# Patient Record
Sex: Female | Born: 1970 | Race: White | Hispanic: No | Marital: Married | Smoking: Never smoker
Health system: Southern US, Community
[De-identification: ages and names within clinical notes are randomized; demographics above are authoritative.]

## PROBLEM LIST (undated history)

## (undated) DIAGNOSIS — E119 Type 2 diabetes mellitus without complications: Secondary | ICD-10-CM

## (undated) DIAGNOSIS — G43909 Migraine, unspecified, not intractable, without status migrainosus: Secondary | ICD-10-CM

## (undated) HISTORY — PX: ABDOMINAL HYSTERECTOMY: SHX81

## (undated) HISTORY — PX: KIDNEY SURGERY: SHX687

## (undated) HISTORY — PX: TONSILLECTOMY: SUR1361

## (undated) HISTORY — DX: Type 2 diabetes mellitus without complications: E11.9

---

## 2013-07-21 ENCOUNTER — Emergency Department (HOSPITAL_BASED_OUTPATIENT_CLINIC_OR_DEPARTMENT_OTHER): Payer: 59

## 2013-07-21 ENCOUNTER — Emergency Department (HOSPITAL_BASED_OUTPATIENT_CLINIC_OR_DEPARTMENT_OTHER)
Admission: EM | Admit: 2013-07-21 | Discharge: 2013-07-21 | Disposition: A | Discharge status: Home / Self Care | Payer: 59 | Attending: Emergency Medicine | Admitting: Emergency Medicine

## 2013-07-21 ENCOUNTER — Encounter (HOSPITAL_BASED_OUTPATIENT_CLINIC_OR_DEPARTMENT_OTHER): Payer: Self-pay | Admitting: Emergency Medicine

## 2013-07-21 DIAGNOSIS — Z880 Allergy status to penicillin: Secondary | ICD-10-CM | POA: Insufficient documentation

## 2013-07-21 DIAGNOSIS — G43109 Migraine with aura, not intractable, without status migrainosus: Secondary | ICD-10-CM | POA: Insufficient documentation

## 2013-07-21 DIAGNOSIS — G43909 Migraine, unspecified, not intractable, without status migrainosus: Secondary | ICD-10-CM

## 2013-07-21 DIAGNOSIS — R209 Unspecified disturbances of skin sensation: Secondary | ICD-10-CM | POA: Insufficient documentation

## 2013-07-21 HISTORY — DX: Migraine, unspecified, not intractable, without status migrainosus: G43.909

## 2013-07-21 MED ORDER — METOCLOPRAMIDE HCL 10 MG PO TABS: 10.0000 mg | ORAL_TABLET | Freq: Four times a day (QID) | ORAL | Status: AC | PRN

## 2013-07-21 MED ORDER — METOCLOPRAMIDE HCL 5 MG/ML IJ SOLN
10.0000 mg | Freq: Once | INTRAMUSCULAR | Status: AC
  Administered 2013-07-21: 10 mg via INTRAVENOUS
  Filled 2013-07-21: qty 2

## 2013-07-21 MED ORDER — SODIUM CHLORIDE 0.9 % IV BOLUS (SEPSIS)
1000.0000 mL | Freq: Once | INTRAVENOUS | Status: AC
  Administered 2013-07-21: 1000 mL via INTRAVENOUS

## 2013-07-21 MED ORDER — DEXAMETHASONE SODIUM PHOSPHATE 10 MG/ML IJ SOLN
10.0000 mg | Freq: Once | INTRAMUSCULAR | Status: AC
  Administered 2013-07-21: 10 mg via INTRAVENOUS
  Filled 2013-07-21: qty 1

## 2013-07-21 MED ORDER — KETOROLAC TROMETHAMINE 30 MG/ML IJ SOLN
30.0000 mg | Freq: Once | INTRAMUSCULAR | Status: AC
  Administered 2013-07-21: 30 mg via INTRAVENOUS
  Filled 2013-07-21: qty 1

## 2013-07-21 MED ORDER — DIPHENHYDRAMINE HCL 50 MG/ML IJ SOLN
50.0000 mg | Freq: Once | INTRAMUSCULAR | Status: AC
  Administered 2013-07-21: 50 mg via INTRAVENOUS
  Filled 2013-07-21: qty 1

## 2013-07-21 NOTE — Discharge Instructions (Signed)
If you were given medicines take as directed.  If you are on coumadin or contraceptives realize their levels and effectiveness is altered by many different medicines.  If you have any reaction (rash, tongues swelling, other) to the medicines stop taking and see a physician.   Please follow up as directed and return to the ER or see a physician for new or worsening symptoms.  Thank you. Take reglan with benadryl for headache or nausea.  Headaches, Frequently Asked Questions MIGRAINE HEADACHES Q: What is migraine? What causes it? How can I treat it? A: Generally, migraine headaches begin as a dull ache. Then they develop into a constant, throbbing, and pulsating pain. You may experience pain at the temples. You may experience pain at the front or back of one or both sides of the head. The pain is usually accompanied by a combination of:  Nausea.  Vomiting.  Sensitivity to light and noise. Some people (about 15%) experience an aura (see below) before an attack. The cause of migraine is believed to be chemical reactions in the brain. Treatment for migraine may include over-the-counter or prescription medications. It may also include self-help techniques. These include relaxation training and biofeedback.  Q: What is an aura? A: About 15% of people with migraine get an "aura". This is a sign of neurological symptoms that occur before a migraine headache. You may see wavy or jagged lines, dots, or flashing lights. You might experience tunnel vision or blind spots in one or both eyes. The aura can include visual or auditory hallucinations (something imagined). It may include disruptions in smell (such as strange odors), taste or touch. Other symptoms include:  Numbness.  A "pins and needles" sensation.  Difficulty in recalling or speaking the correct word. These neurological events may last as long as 60 minutes. These symptoms will fade as the headache begins. Q: What is a trigger? A: Certain  physical or environmental factors can lead to or "trigger" a migraine. These include:  Foods.  Hormonal changes.  Weather.  Stress. It is important to remember that triggers are different for everyone. To help prevent migraine attacks, you need to figure out which triggers affect you. Keep a headache diary. This is a good way to track triggers. The diary will help you talk to your healthcare professional about your condition. Q: Does weather affect migraines? A: Bright sunshine, hot, humid conditions, and drastic changes in barometric pressure may lead to, or "trigger," a migraine attack in some people. But studies have shown that weather does not act as a trigger for everyone with migraines. Q: What is the link between migraine and hormones? A: Hormones start and regulate many of your body's functions. Hormones keep your body in balance within a constantly changing environment. The levels of hormones in your body are unbalanced at times. Examples are during menstruation, pregnancy, or menopause. That can lead to a migraine attack. In fact, about three quarters of all women with migraine report that their attacks are related to the menstrual cycle.  Q: Is there an increased risk of stroke for migraine sufferers? A: The likelihood of a migraine attack causing a stroke is very remote. That is not to say that migraine sufferers cannot have a stroke associated with their migraines. In persons under age 63, the most common associated factor for stroke is migraine headache. But over the course of a person's normal life span, the occurrence of migraine headache may actually be associated with a reduced risk of dying from  cerebrovascular disease due to stroke.  Q: What are acute medications for migraine? A: Acute medications are used to treat the pain of the headache after it has started. Examples over-the-counter medications, NSAIDs, ergots, and triptans.  Q: What are the triptans? A: Triptans are the  newest class of abortive medications. They are specifically targeted to treat migraine. Triptans are vasoconstrictors. They moderate some chemical reactions in the brain. The triptans work on receptors in your brain. Triptans help to restore the balance of a neurotransmitter called serotonin. Fluctuations in levels of serotonin are thought to be a main cause of migraine.  Q: Are over-the-counter medications for migraine effective? A: Over-the-counter, or "OTC," medications may be effective in relieving mild to moderate pain and associated symptoms of migraine. But you should see your caregiver before beginning any treatment regimen for migraine.  Q: What are preventive medications for migraine? A: Preventive medications for migraine are sometimes referred to as "prophylactic" treatments. They are used to reduce the frequency, severity, and length of migraine attacks. Examples of preventive medications include antiepileptic medications, antidepressants, beta-blockers, calcium channel blockers, and NSAIDs (nonsteroidal anti-inflammatory drugs). Q: Why are anticonvulsants used to treat migraine? A: During the past few years, there has been an increased interest in antiepileptic drugs for the prevention of migraine. They are sometimes referred to as "anticonvulsants". Both epilepsy and migraine may be caused by similar reactions in the brain.  Q: Why are antidepressants used to treat migraine? A: Antidepressants are typically used to treat people with depression. They may reduce migraine frequency by regulating chemical levels, such as serotonin, in the brain.  Q: What alternative therapies are used to treat migraine? A: The term "alternative therapies" is often used to describe treatments considered outside the scope of conventional Western medicine. Examples of alternative therapy include acupuncture, acupressure, and yoga. Another common alternative treatment is herbal therapy. Some herbs are believed to  relieve headache pain. Always discuss alternative therapies with your caregiver before proceeding. Some herbal products contain arsenic and other toxins. TENSION HEADACHES Q: What is a tension-type headache? What causes it? How can I treat it? A: Tension-type headaches occur randomly. They are often the result of temporary stress, anxiety, fatigue, or anger. Symptoms include soreness in your temples, a tightening band-like sensation around your head (a "vice-like" ache). Symptoms can also include a pulling feeling, pressure sensations, and contracting head and neck muscles. The headache begins in your forehead, temples, or the back of your head and neck. Treatment for tension-type headache may include over-the-counter or prescription medications. Treatment may also include self-help techniques such as relaxation training and biofeedback. CLUSTER HEADACHES Q: What is a cluster headache? What causes it? How can I treat it? A: Cluster headache gets its name because the attacks come in groups. The pain arrives with little, if any, warning. It is usually on one side of the head. A tearing or bloodshot eye and a runny nose on the same side of the headache may also accompany the pain. Cluster headaches are believed to be caused by chemical reactions in the brain. They have been described as the most severe and intense of any headache type. Treatment for cluster headache includes prescription medication and oxygen. SINUS HEADACHES Q: What is a sinus headache? What causes it? How can I treat it? A: When a cavity in the bones of the face and skull (a sinus) becomes inflamed, the inflammation will cause localized pain. This condition is usually the result of an allergic reaction, a tumor, or  an infection. If your headache is caused by a sinus blockage, such as an infection, you will probably have a fever. An x-ray will confirm a sinus blockage. Your caregiver's treatment might include antibiotics for the infection, as  well as antihistamines or decongestants.  REBOUND HEADACHES Q: What is a rebound headache? What causes it? How can I treat it? A: A pattern of taking acute headache medications too often can lead to a condition known as "rebound headache." A pattern of taking too much headache medication includes taking it more than 2 days per week or in excessive amounts. That means more than the label or a caregiver advises. With rebound headaches, your medications not only stop relieving pain, they actually begin to cause headaches. Doctors treat rebound headache by tapering the medication that is being overused. Sometimes your caregiver will gradually substitute a different type of treatment or medication. Stopping may be a challenge. Regularly overusing a medication increases the potential for serious side effects. Consult a caregiver if you regularly use headache medications more than 2 days per week or more than the label advises. ADDITIONAL QUESTIONS AND ANSWERS Q: What is biofeedback? A: Biofeedback is a self-help treatment. Biofeedback uses special equipment to monitor your body's involuntary physical responses. Biofeedback monitors:  Breathing.  Pulse.  Heart rate.  Temperature.  Muscle tension.  Brain activity. Biofeedback helps you refine and perfect your relaxation exercises. You learn to control the physical responses that are related to stress. Once the technique has been mastered, you do not need the equipment any more. Q: Are headaches hereditary? A: Four out of five (80%) of people that suffer report a family history of migraine. Scientists are not sure if this is genetic or a family predisposition. Despite the uncertainty, a child has a 50% chance of having migraine if one parent suffers. The child has a 75% chance if both parents suffer.  Q: Can children get headaches? A: By the time they reach high school, most young people have experienced some type of headache. Many safe and effective  approaches or medications can prevent a headache from occurring or stop it after it has begun.  Q: What type of doctor should I see to diagnose and treat my headache? A: Start with your primary caregiver. Discuss his or her experience and approach to headaches. Discuss methods of classification, diagnosis, and treatment. Your caregiver may decide to recommend you to a headache specialist, depending upon your symptoms or other physical conditions. Having diabetes, allergies, etc., may require a more comprehensive and inclusive approach to your headache. The National Headache Foundation will provide, upon request, a list of [facility] physician members in your state. Document Released: 09/25/2003 Document Revised: 09/27/2011 Document Reviewed: 03/04/2008 [facility] Patient Information 2014 [place], [place].

## 2013-07-21 NOTE — ED Notes (Signed)
Took Pt to the restroom

## 2013-07-21 NOTE — ED Notes (Signed)
Pt having headache x 5 days.  Pt states it is NOT typical of her migraines.  Location is different and it not responding to medications.  No fever or neck stiffness.

## 2013-07-21 NOTE — ED Provider Notes (Addendum)
CSN: [id number]     Arrival date & time 07/21/13  [id number] History   First MD Initiated Contact with Patient 07/21/13 [id number]     Chief Complaint  Patient presents with  . Headache   (Consider location/radiation/quality/duration/timing/severity/associated sxs/prior Treatment) HPI Comments: 43 yo female with migraine hx, non smoker, no blood clot or stroke hx presents with headache.  Pt has had a headache the past 5 days, intermittent, worse in the morning, located behind right eye.  Pt has had migraines in the past and the feeling is similar but more severe and location is different. She has had the similar visual aura with these migraine.  She has mild right arm numbness.  No head injury, travel, fevers or neck stiffness.  No vision loss. She tried normal triptan/ OTC meds but no improvement.  Gradual onset, gradual worsening.  No CNS bleed hx.   Patient is a 43 y.o. female presenting with headaches. The history is provided by the patient.  Headache   Past Medical History  Diagnosis Date  . Migraine    Past Surgical History  Procedure Laterality Date  . Abdominal hysterectomy    . Kidney surgery    . Tonsillectomy     No family history on file. History  Substance Use Topics  . Smoking status: Never Smoker   . Smokeless tobacco: Not on file  . Alcohol Use: Not on file   OB History   Grav Para Term Preterm Abortions TAB SAB Ect Mult Living                 Review of Systems  Neurological: Positive for headaches.    Allergies  Penicillins and Sulfa antibiotics  Home Medications   Current Outpatient Rx  Name  Route  Sig  Dispense  Refill  . acetaminophen-codeine (TYLENOL #3) 300-30 MG per tablet   Oral   Take by mouth every 4 (four) hours as needed for moderate pain.         . rizatriptan (MAXALT) 10 MG tablet   Oral   Take 10 mg by mouth as needed for migraine. May repeat in 2 hours if needed          BP 131/73  Pulse 81  Temp(Src) 98 F (36.7 C) (Oral)  Resp 20   Ht 5\' 10"  (1.778 m)  Wt 212 lb (96.163 kg)  BMI 30.42 kg/m2  SpO2 100% Physical Exam  Nursing note and vitals reviewed. Constitutional: She is oriented to person, place, and time. She appears well-developed and well-nourished.  HENT:  Head: Normocephalic and atraumatic.  Mild dry mm  Eyes: Conjunctivae are normal. Right eye exhibits no discharge. Left eye exhibits no discharge.  Neck: Normal range of motion. Neck supple. No tracheal deviation present.  Cardiovascular: Normal rate and regular rhythm.   Pulmonary/Chest: Effort normal and breath sounds normal.  Abdominal: Soft. She exhibits no distension. There is no tenderness. There is no guarding.  Musculoskeletal: She exhibits no edema.  Neurological: She is alert and oriented to person, place, and time.  5+ strength in UE and LE with f/e at major joints. Sensation to palpation intact in UE and LE. CNs 2-12 grossly intact.  EOMFI.  PERRL.   Finger nose and coordination intact bilateral.   Visual fields intact to finger testing.   Skin: Skin is warm. No rash noted.  Psychiatric: She has a normal mood and affect.    ED Course  Procedures (including critical care time) Labs Review Labs Reviewed -  No data to display Imaging Review Ct Head Wo Contrast  07/21/2013   CLINICAL DATA:  Headache for 5 days  EXAM: CT HEAD WITHOUT CONTRAST  TECHNIQUE: Contiguous axial images were obtained from the base of the skull through the vertex without intravenous contrast.  COMPARISON:  None.  FINDINGS: No acute intracranial hemorrhage. No focal mass lesion. No CT evidence of acute infarction. No midline shift or mass effect. No hydrocephalus. Basilar cisterns are patent. Paranasal sinuses and mastoid air cells are clear.  IMPRESSION: Normal head CT   Electronically Signed   By: Genevive Bi M.D.   On: 07/21/2013 11:15    EKG Interpretation   None       MDM   1. Migraine    Many components of exam/ hpi migraine like- hx of migraine,  aura, unilateral however pain is more severe and different location.  Normal neuro exam.  Low risk stroke.  Screening head CT. Fluids, migraine cocktail. CT no acute findings, pt improved significantly in ED on recheck.  No concern for [facility] or meningitis at this time.  Results and differential diagnosis were discussed with the patient. Close follow up outpatient was discussed, patient comfortable with the plan.         Enid Skeens, MD 07/21/13 1302  Enid Skeens, MD 07/21/13 1302

## 2015-01-07 IMAGING — CT CT HEAD W/O CM
1 series · 16 of 30 positions shown, 20 images · non-contrast
Comparison: None.

CLINICAL DATA: Headache for 5 days

EXAM:
CT HEAD WITHOUT CONTRAST
TECHNIQUE: Contiguous axial images were obtained from the base of the skull
through the vertex without intravenous contrast.

[Series 2: head 4.8 h37s · axial · 0.45mm/px · z∈[-170,-37]mm · 16 of 32 slices shown, 20 images]
[im 2/32  brain]
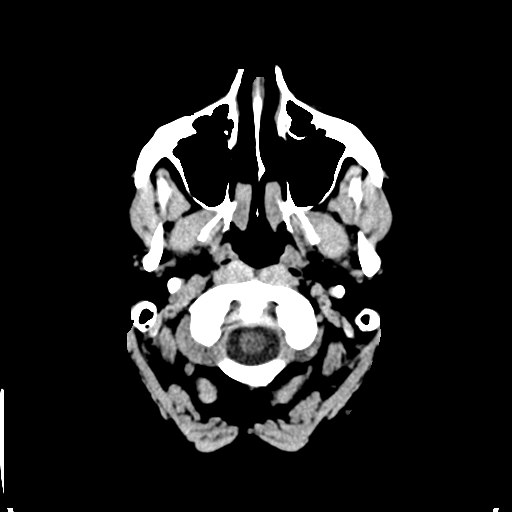
[im 2/32  bone]
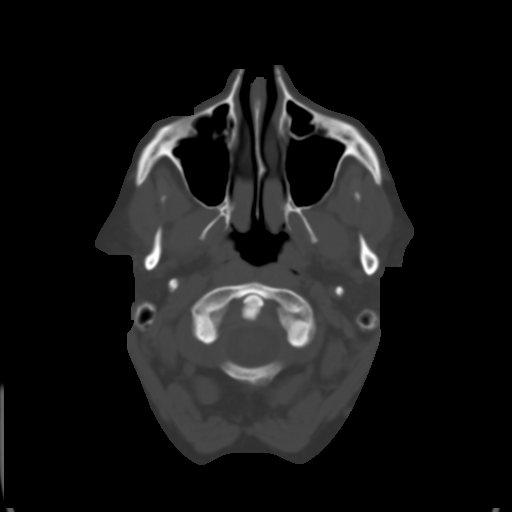
[im 4/32  brain]
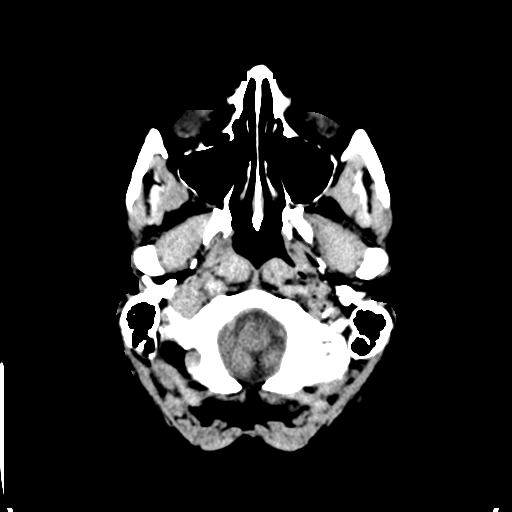
[im 6/32  brain]
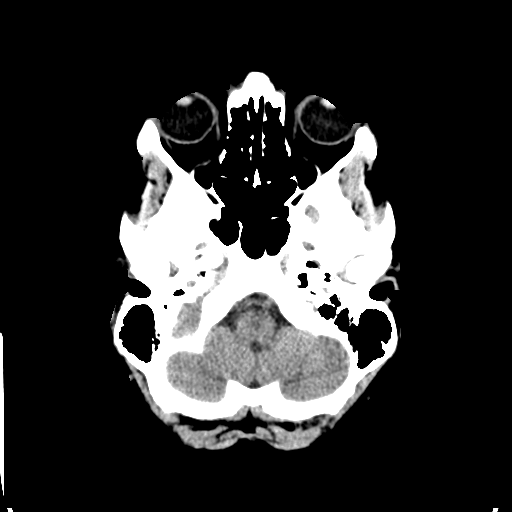
[im 8/32  brain]
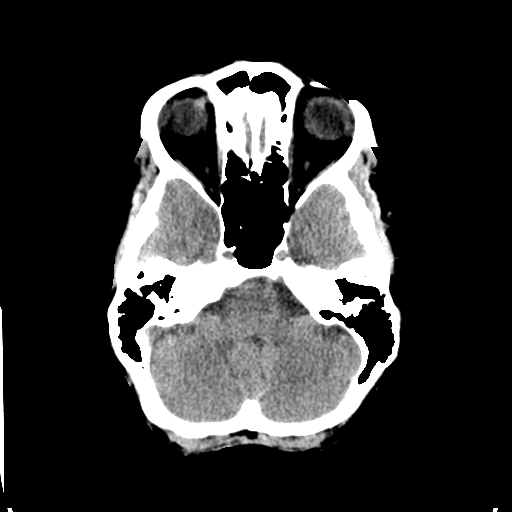
[im 9/32  brain]
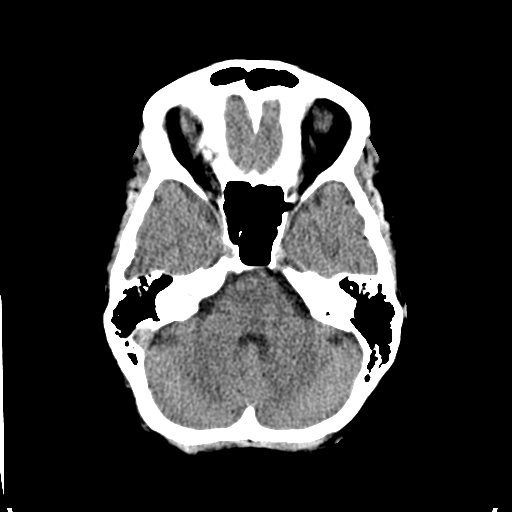
[im 9/32  bone]
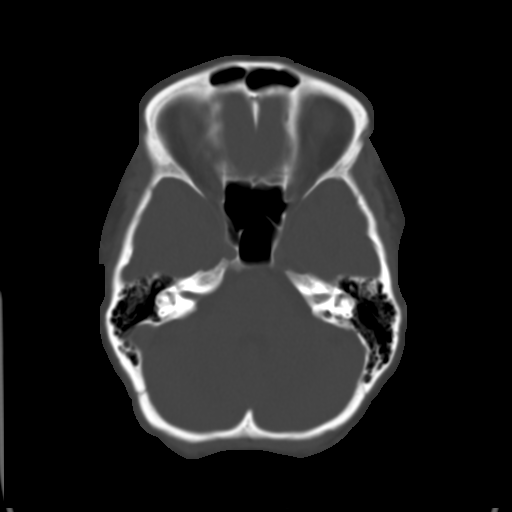
[im 11/32  brain]
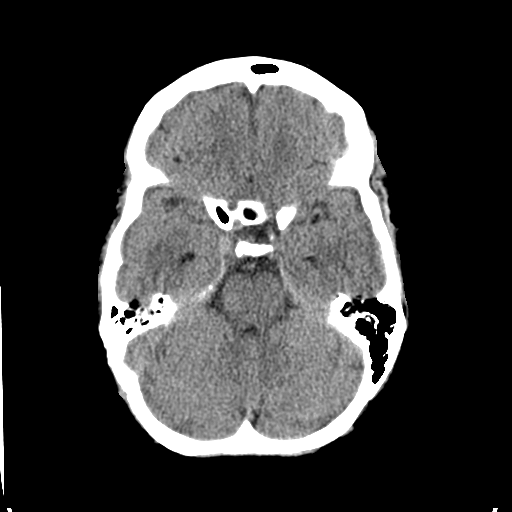
[im 13/32  brain]
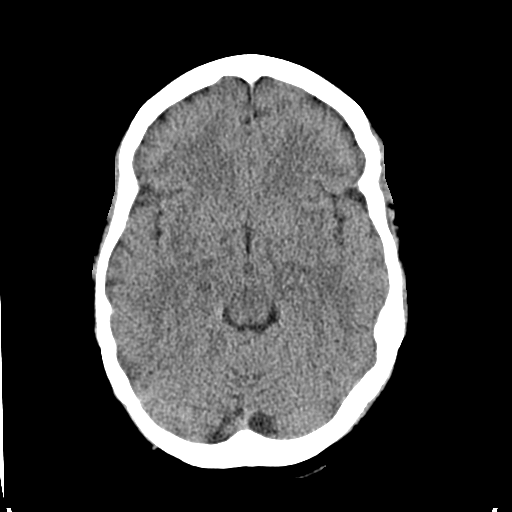
[im 15/32  brain]
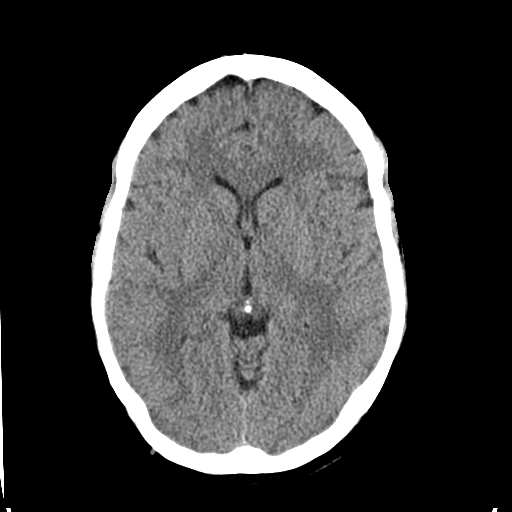
[im 17/32  brain]
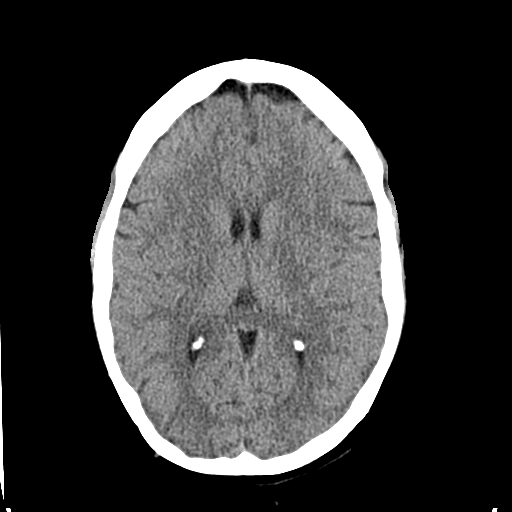
[im 17/32  bone]
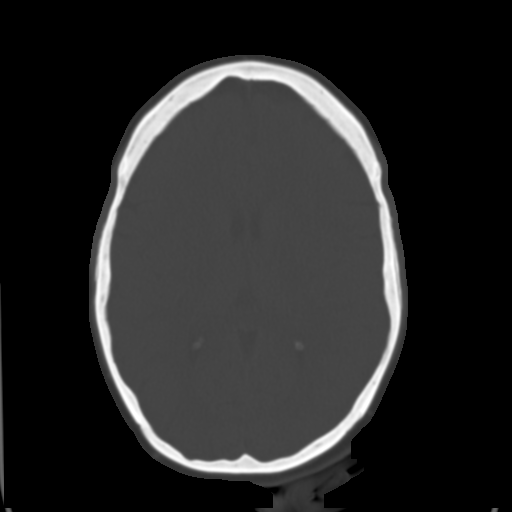
[im 19/32  brain]
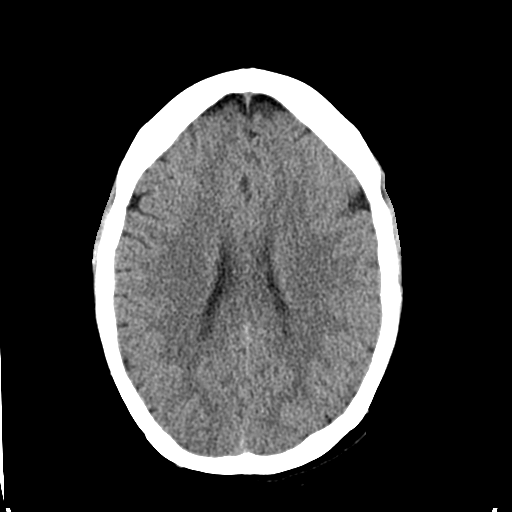
[im 21/32  brain]
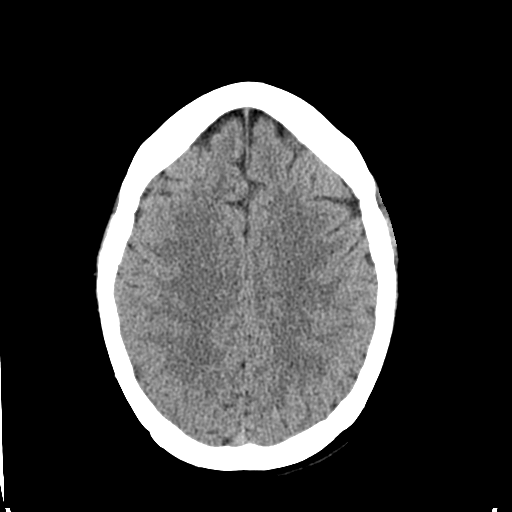
[im 23/32  brain]
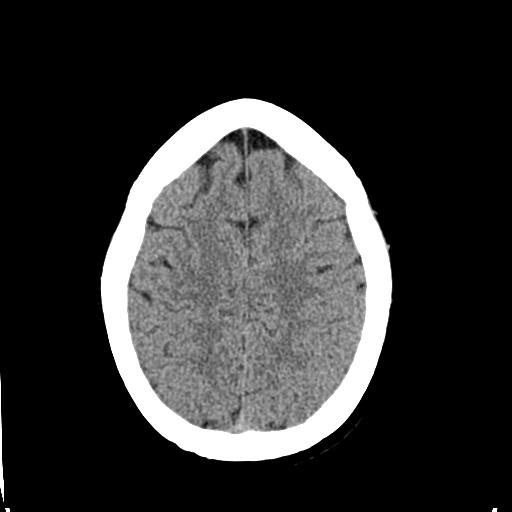
[im 24/32  brain]
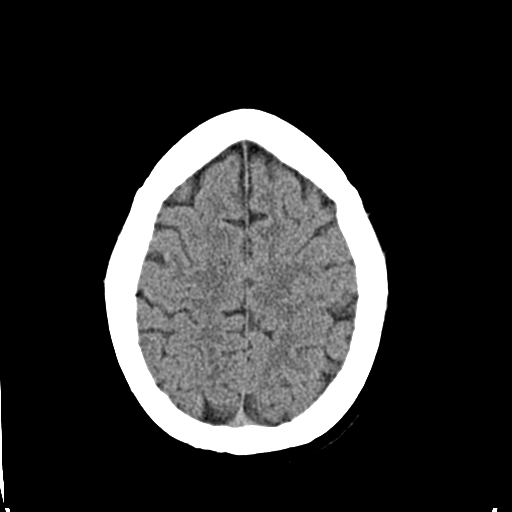
[im 24/32  bone]
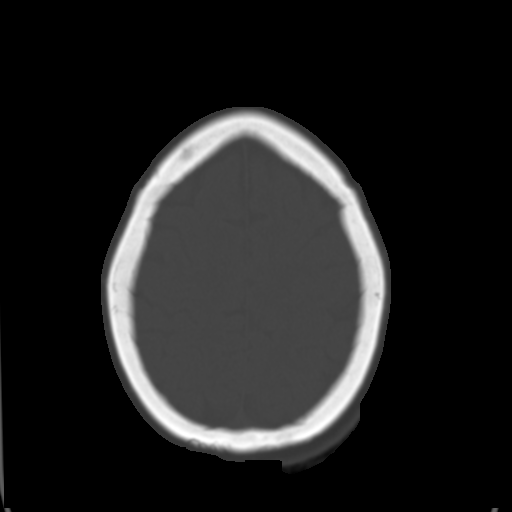
[im 26/32  brain]
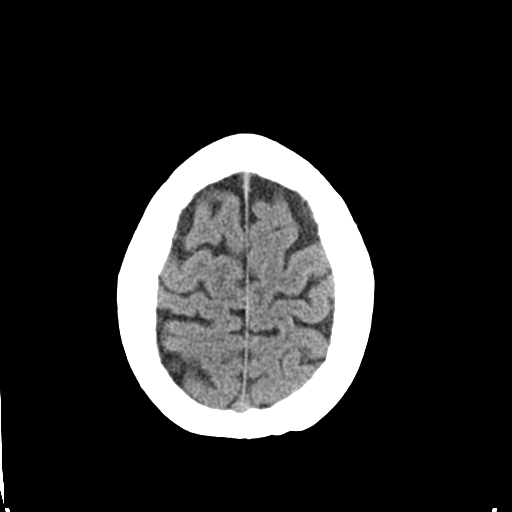
[im 28/32  brain]
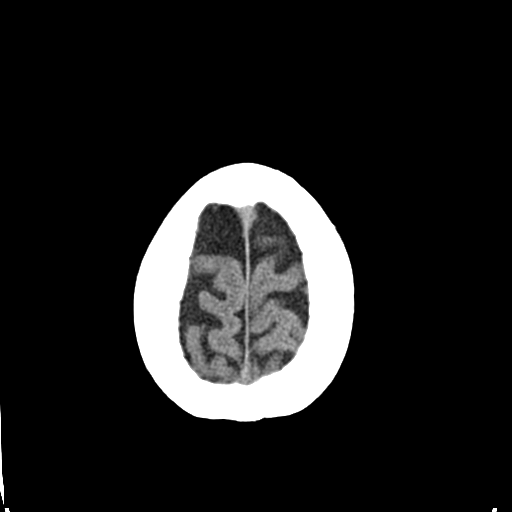
[im 30/32  brain]
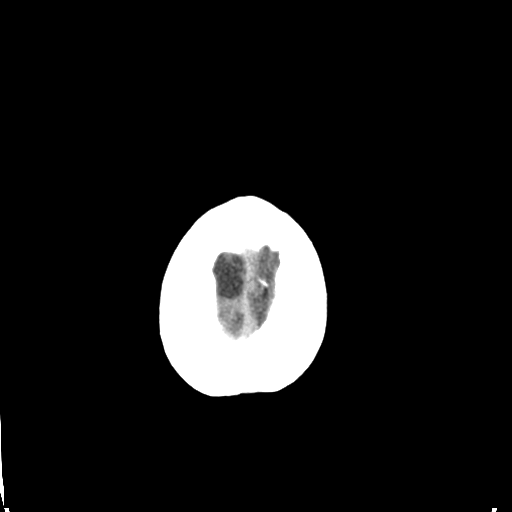

[16 of 30 positions shown; findings below may reference images not displayed]

FINDINGS: No acute intracranial hemorrhage. No focal mass lesion. No CT
evidence of acute infarction. No midline shift or mass effect. No
hydrocephalus. Basilar cisterns are patent. Paranasal sinuses and
mastoid air cells are clear.
IMPRESSION: Normal head CT

## 2015-06-24 ENCOUNTER — Other Ambulatory Visit: Payer: Self-pay | Admitting: Orthopedic Surgery

## 2015-06-24 DIAGNOSIS — M25562 Pain in left knee: Secondary | ICD-10-CM

## 2024-02-20 ENCOUNTER — Ambulatory Visit (INDEPENDENT_AMBULATORY_CARE_PROVIDER_SITE_OTHER): Admitting: Obstetrics and Gynecology

## 2024-02-20 ENCOUNTER — Encounter: Payer: Self-pay | Admitting: Obstetrics and Gynecology

## 2024-02-20 VITALS — BP 130/74 | HR 77 | Ht 68.0 in | Wt 171.0 lb

## 2024-02-20 DIAGNOSIS — K5901 Slow transit constipation: Secondary | ICD-10-CM | POA: Diagnosis not present

## 2024-02-20 DIAGNOSIS — R350 Frequency of micturition: Secondary | ICD-10-CM | POA: Diagnosis not present

## 2024-02-20 DIAGNOSIS — R3915 Urgency of urination: Secondary | ICD-10-CM

## 2024-02-20 DIAGNOSIS — N952 Postmenopausal atrophic vaginitis: Secondary | ICD-10-CM

## 2024-02-20 DIAGNOSIS — R399 Unspecified symptoms and signs involving the genitourinary system: Secondary | ICD-10-CM

## 2024-02-20 LAB — POCT URINALYSIS DIP (CLINITEK)
Bilirubin, UA: NEGATIVE
Blood, UA: NEGATIVE
Glucose, UA: NEGATIVE mg/dL
Ketones, POC UA: NEGATIVE mg/dL
Leukocytes, UA: NEGATIVE
Nitrite, UA: NEGATIVE
POC PROTEIN,UA: NEGATIVE
Spec Grav, UA: 1.015 (ref 1.010–1.025)
Urobilinogen, UA: 0.2 U/dL
pH, UA: 6 (ref 5.0–8.0)

## 2024-02-20 MED ORDER — VIBEGRON 75 MG PO TABS: 75.0000 mg | ORAL_TABLET | Freq: Every day | ORAL | 5 refills | Status: DC

## 2024-02-20 MED ORDER — ESTRADIOL 0.1 MG/GM VA CREA: 0.5000 g | TOPICAL_CREAM | VAGINAL | 11 refills | Status: AC

## 2024-02-20 NOTE — Patient Instructions (Addendum)
 Today we talked about ways to manage bladder urgency such as altering your diet to avoid irritative beverages and foods (bladder diet) as well as attempting to decrease stress and other exacerbating factors.  You can also chew a plain Tums 1-3 times per day to make your urine less acidic, especially if you have eating/drinking acidic things.    The Most Bothersome Foods* The Least Bothersome Foods*  Coffee - Regular & Decaf Tea - caffeinated Carbonated beverages - cola, non-colas, diet & caffeine-free Alcohols - Beer, Red Wine, White Wine, [address] - Grapefruit, [place], Orange, [employer] - Cranberry, Grapefruit, Orange, Pineapple Vegetables - Tomato & Tomato Products Flavor Enhancers - Hot peppers, Spicy foods, Chili, Horseradish, Vinegar, Monosodium glutamate (MSG) Artificial Sweeteners - NutraSweet, Sweet 'N Low, Equal (sweetener), Saccharin Ethnic foods - [place], [place], [place] food [employer] - low-fat & whole Fruits - Bananas, Blueberries, Honeydew melon, Pears, Raisins, Watermelon Vegetables - Broccoli, [address] Sprouts, [place], Carrots, Cauliflower, [place], Cucumber, Mushrooms, Peas, Radishes, Squash, Zucchini, White potatoes, Sweet potatoes & yams Poultry - Chicken, Eggs, [place], [employer] - Beef, Diplomatic Services operational officer, Lamb Seafood - Shrimp, [place] fish, Salmon Grains - Oat, Rice Snacks - Pretzels, Popcorn  *Mitch ALF et al. Diet and its role in interstitial cystitis/bladder pain syndrome (IC/BPS) and comorbid conditions. [employer]. BJU Int. 2012 Jan 11.    Constipation: Our goal is to achieve formed bowel movements daily or every-other-day.  You may need to try different combinations of the following options to find what works best for you - everybody's body works differently so feel free to adjust the dosages as needed.  Some options to help maintain bowel health include:  Dietary changes (more leafy greens, vegetables and fruits; less processed foods) Fiber  supplementation (Benefiber, FiberCon, Metamucil or Psyllium). Start slow and increase gradually to full dose. Over-the-counter agents such as: stool softeners (Docusate or Colace) and/or laxatives (Miralax, milk of magnesia)  Power Pudding is a natural mixture that may help your constipation.  To make blend 1 cup applesauce, 1 cup wheat bran, and 3/4 cup prune juice, refrigerate and then take 1 tablespoon daily with a large glass of water as needed.

## 2024-02-20 NOTE — Progress Notes (Signed)
 [facility] New Patient Evaluation and Consultation  Referring Provider: Duwayne Katheryn HERO, PA PCP: Adine Pellet, DO Date of Service: 02/20/2024  SUBJECTIVE Chief Complaint: New Pt (Urgency, even with normal fluid intake, has to bend over to empty bladder, had bladder tack after kids in [facility], on vesicare not really helping, recurrent UTIs - treated at UTI)  History of Present Illness: Gloria Harvey is a 53 y.o. White or Caucasian female seen in consultation at the request of Dr. Duwayne for evaluation of frequency/urgency and rUTI.    Review of records significant for:  Patient has tried Personal assistant and failed.   Cultures: 02/13/24: No Growth 09/29/23: No Growth 08/26/23: 60-100k+ Enterococcus Faecalis 04/28/23: + 60-100K Enterococcus Faecalis & 10-50k Escherichia coli 12/22/22: No Growth  Hx of hysterectomy and has mesh sling   Urinary Symptoms: Leaks urine with with movement to the bathroom and with urgency Leaks 1-2 time(s) per days.  Pad use: 2 liners/ mini-pads per day.   Patient is bothered by UI symptoms.  Day time voids 20.  Nocturia: 1 times per night to void. Voiding dysfunction:  does not empty bladder well.  Patient does not use a catheter to empty bladder.  When urinating, patient feels the need to urinate multiple times in a row Drinks: Coffee and 64oz+ water per day  UTIs: 6 UTI's in the last year.   Denies history of blood in urine, kidney or bladder stones, pyelonephritis, bladder cancer, and kidney cancer No results found for the last 90 days.   Pelvic Organ Prolapse Symptoms:                  Patient Denies a feeling of a bulge the vaginal area.   Bowel Symptom: Bowel movements: 2-3 time(s) per week Stool consistency: solid Straining: yes.  Splinting: no.  Incomplete evacuation: no.  Patient Denies accidental bowel leakage / fecal incontinence Bowel regimen: miralax Last colonoscopy: Date 2020, Results 3 polyps removed HM  Colonoscopy          Current Care Gaps     Colonoscopy (Every 10 Years) Never done   No completion history exists for this topic.                 Sexual Function Sexually active: no.  Sexual orientation: Straight Pain with sex: No  Pelvic Pain Denies pelvic pain    Past Medical History:  Past Medical History:  Diagnosis Date   Diabetes mellitus without complication (HCC)    type 2   Migraine      Past Surgical History:   Past Surgical History:  Procedure Laterality Date   ABDOMINAL HYSTERECTOMY     KIDNEY SURGERY     TONSILLECTOMY       Past OB/GYN History: [id number] Menopausal: Yes, at age 28 Contraception: Hyst. Last pap smear was Hyst.  Any history of abnormal pap smears: no. HM PAP   This patient has no relevant Health Maintenance data.     Medications: Patient has a current medication list which includes the following prescription(s): estradiol , mounjaro, pantoprazole, rizatriptan, vibegron , acetaminophen-codeine, and metoclopramide .   Allergies: Patient is allergic to penicillins and sulfa antibiotics.   Social History:  Social History   Tobacco Use   Smoking status: Never  Substance Use Topics   Alcohol use: Never   Drug use: Never    Relationship status: married Patient lives with Husband and 2 children.   Patient is employed Therapist, sports. Regular exercise: No History of abuse:  No  Family History:   Family History  Problem Relation Age of Onset   Diabetes Mother      Review of Systems: Review of Systems  Constitutional:  Negative for chills and fever.  Respiratory:  Negative for cough and shortness of breath.   Cardiovascular:  Negative for chest pain and palpitations.  Gastrointestinal:  Negative for abdominal pain, blood in stool, constipation and diarrhea.  Skin:  Positive for rash (under breasts).  Neurological:  Positive for headaches. Negative for weakness.  Psychiatric/Behavioral:  Negative for depression  and suicidal ideas.      OBJECTIVE Physical Exam: Vitals:   02/20/24 1418  BP: 130/74  Pulse: 77  Weight: 171 lb (77.6 kg)  Height: 5' 8 (1.727 m)    Physical Exam Vitals reviewed. Exam conducted with a chaperone present.  Constitutional:      Appearance: Normal appearance.  Pulmonary:     Effort: Pulmonary effort is normal.  Abdominal:     Palpations: Abdomen is soft.  Neurological:     General: No focal deficit present.     Mental Status: She is alert and oriented to person, place, and time.  Psychiatric:        Mood and Affect: Mood normal.        Behavior: Behavior normal. Behavior is cooperative.        Thought Content: Thought content normal.      GU / Detailed Urogynecologic Evaluation:  Pelvic Exam: Normal external female genitalia; Bartholin's and Skene's glands normal in appearance; urethral meatus normal in appearance, no urethral masses or discharge.   CST: negative  s/p hysterectomy: Speculum exam reveals normal vaginal mucosa with  atrophy and normal vaginal cuff.  Adnexa normal adnexa.    With apex supported, anterior compartment defect was reduced  Pelvic floor strength II/V  Pelvic floor musculature: Right levator non-tender, Right obturator tender, Left levator non-tender, Left obturator non-tender  POP-Q:   POP-Q  -2.5                                            Aa   -2.5                                           Ba  -7                                              C   3                                            Gh  3.5                                            Pb  9.5  tvl   -2.5                                            Ap  -2.5                                            Bp                                                 D      Rectal Exam:  Normal external exam.  Post-Void Residual (PVR) by Bladder Scan: In order to evaluate bladder emptying, we discussed obtaining a  postvoid residual and patient agreed to this procedure.  Procedure: The ultrasound unit was placed on the patient's abdomen in the suprapubic region after the patient had voided.    Post Void Residual - 02/20/24 1425       Post Void Residual   Post Void Residual 43 mL           Laboratory Results: Lab Results  Component Value Date   COLORU yellow 02/20/2024   CLARITYU clear 02/20/2024   GLUCOSEUR negative 02/20/2024   BILIRUBINUR negative 02/20/2024   SPECGRAV 1.015 02/20/2024   RBCUR negative 02/20/2024   PHUR 6.0 02/20/2024   UROBILINOGEN 0.2 02/20/2024   LEUKOCYTESUR Negative 02/20/2024     A1c 02/13/24 5.5  eGFR >90 (02/13/24)   ASSESSMENT AND PLAN Gloria Harvey is a 53 y.o. with:  1. Urinary frequency   2. Urgency of micturition   3. Vaginal atrophy   4. Slow transit constipation   5. Lower urinary tract symptoms (LUTS)    We discussed the symptoms of overactive bladder (OAB), which include urinary urgency, urinary frequency, nocturia, with or without urge incontinence.  While we do not know the exact etiology of OAB, several treatment options exist. We discussed management including behavioral therapy (decreasing bladder irritants, urge suppression strategies, timed voids, bladder retraining), physical therapy, medication; for refractory cases posterior tibial nerve stimulation, sacral neuromodulation, and intravesical botulinum toxin injection. For anticholinergic medications, we discussed the potential side effects of anticholinergics including dry eyes, dry mouth, constipation, cognitive impairment and urinary retention. For Beta-3 agonist medication, we discussed the potential side effect of elevated blood pressure which is more likely to occur in individuals with uncontrolled hypertension. Patient has already failed Vesicare and Myrbetriq, would not recommend other anticholinergic medications as she has chronic constipation and only has a bowel movement 2-3 times per  week. We discussed specific reduction in her use of irritative/acidic additives to her water. Will start patient on Gemtesa  75mg  daily. Samples given today as we may need a Prior auth. Patient does not plan to start this until after her knee surgery.  Patient has vaginal atrophy on exam. She would benefit from estrogen cream. Patient to use a blueberry sized amount into the vagina. She may use this nightly for 2 weeks and then twice weekly after. We discussed using her finger instead of using the applicator.  We discussed her increasing her use of miralax to daily use as she is currently using it 2-3 times a week, or she can  consider doing power pudding for a more natural support of her bowels.  For patient's LUTS symptoms we discussed starting with just the estrogen cream for now and if she has another UTI we can consider antibiotic prophylaxis vs anti-infectives (Hiprex). She is open to this plan of care and will let us  know if she has UTI symptoms.   Patient to return in 3 months or sooner if needed as she will be recovering from knee replacement surgery. Encouraged patient to call with any concerns or UTI symptoms.     Shadae Reino G Jenea Dake, NP

## 2024-02-27 ENCOUNTER — Other Ambulatory Visit: Payer: Self-pay | Admitting: Obstetrics and Gynecology

## 2024-02-27 DIAGNOSIS — R3915 Urgency of urination: Secondary | ICD-10-CM

## 2024-02-27 DIAGNOSIS — R350 Frequency of micturition: Secondary | ICD-10-CM

## 2024-04-20 ENCOUNTER — Other Ambulatory Visit: Payer: Self-pay

## 2024-04-20 DIAGNOSIS — R3915 Urgency of urination: Secondary | ICD-10-CM

## 2024-04-20 DIAGNOSIS — R350 Frequency of micturition: Secondary | ICD-10-CM

## 2024-04-20 MED ORDER — VIBEGRON 75 MG PO TABS: 75.0000 mg | ORAL_TABLET | Freq: Every day | ORAL | 5 refills | Status: DC

## 2024-04-23 ENCOUNTER — Telehealth: Payer: Self-pay

## 2024-04-23 ENCOUNTER — Other Ambulatory Visit: Payer: Self-pay

## 2024-04-23 MED ORDER — VIBEGRON 75 MG PO TABS: 75.0000 mg | ORAL_TABLET | Freq: Every day | ORAL | 2 refills | Status: DC

## 2024-04-23 NOTE — Telephone Encounter (Signed)
 Sent!

## 2024-04-23 NOTE — Addendum Note (Signed)
 Addended by: Delmus Warwick N on: 04/23/2024 12:00 PM   Modules accepted: Orders

## 2024-04-23 NOTE — Telephone Encounter (Signed)
 Patient called wanting a 90 day supply stating she has a coupon that can make it affordable but will still like to start PA through her insurance.

## 2024-04-24 ENCOUNTER — Telehealth: Payer: Self-pay

## 2024-04-24 NOTE — Telephone Encounter (Signed)
 Submitted PA for Gemtesa  on Cover my Meds. Key: [id number]  PA Case ID: [id number]      Status: Approved  Prior Auth: Coverage Start Date:04/24/2025- Coverage End Date:04/24/2025

## 2024-04-24 NOTE — Telephone Encounter (Signed)
 Pending response

## 2024-04-24 NOTE — Telephone Encounter (Signed)
 Patient call about her PA for Gemtesa . She sates that documentation needs to be sent to her insurance. Any requests you've received for the PA

## 2024-05-18 NOTE — Telephone Encounter (Signed)
 Yes, Medication has been approved.

## 2024-05-21 ENCOUNTER — Ambulatory Visit: Admitting: Obstetrics and Gynecology

## 2024-06-18 ENCOUNTER — Encounter: Payer: Self-pay | Admitting: Obstetrics and Gynecology

## 2024-06-18 ENCOUNTER — Ambulatory Visit: Admitting: Obstetrics and Gynecology

## 2024-06-18 VITALS — BP 123/73 | HR 88

## 2024-06-18 DIAGNOSIS — R350 Frequency of micturition: Secondary | ICD-10-CM

## 2024-06-18 DIAGNOSIS — R3915 Urgency of urination: Secondary | ICD-10-CM | POA: Diagnosis not present

## 2024-06-18 LAB — POCT URINE DIPSTICK
Bilirubin, UA: NEGATIVE
Blood, UA: NEGATIVE
Glucose, UA: NEGATIVE mg/dL
Leukocytes, UA: NEGATIVE
Nitrite, UA: NEGATIVE
Spec Grav, UA: >=1.03 — AB (ref 1.010–1.025)
Urobilinogen, UA: 0.2 U/dL
pH, UA: 5.5 (ref 5.0–8.0)

## 2024-06-18 MED ORDER — VIBEGRON 75 MG PO TABS: 75.0000 mg | ORAL_TABLET | Freq: Every day | ORAL | 2 refills | Status: AC

## 2024-06-18 NOTE — Progress Notes (Signed)
 [facility] Return Visit  SUBJECTIVE  History of Present Illness: Gloria Harvey is a 53 y.o. female seen in follow-up for vaginal atrophy, LUTs and OAB. Plan at last visit was start vaginal estrogen cream for LUTS/atrophic changes of the vagina, and start Gemtesa  75mg  daily. The office completed a PA for the Gemtesa  which is approved through Oct. 2026.   She reports she is heavily improved with the Gemtesa  75mg  daily, but that she has still has some urgency in the morning.  Reports she has been well controlled on her LUTS and her vaginal atrophy with the estrogen cream. Has not attempted to have intercourse since starting estrogen cream.     Past Medical History: Patient  has a past medical history of Diabetes mellitus without complication (HCC) and Migraine.   Past Surgical History: She  has a past surgical history that includes Abdominal hysterectomy; Kidney surgery; and Tonsillectomy.   Medications: She has a current medication list which includes the following prescription(s): acetaminophen-codeine, estradiol , metoclopramide , mounjaro, pantoprazole, rizatriptan, and vibegron .   Allergies: Patient is allergic to penicillins and sulfa antibiotics.   Social History: Patient  reports that she has never smoked. She does not have any smokeless tobacco history on file. She reports that she does not drink alcohol and does not use drugs.     OBJECTIVE    Lab Results  Component Value Date   COLORU yellow 06/18/2024   CLARITYU clear 06/18/2024   GLUCOSEUR negative 06/18/2024   BILIRUBINUR negative 06/18/2024   SPECGRAV >=1.030 (A) 06/18/2024   RBCUR negative 06/18/2024   PHUR 5.5 06/18/2024   UROBILINOGEN 0.2 06/18/2024   LEUKOCYTESUR Negative 06/18/2024     Physical Exam: Vitals:   06/18/24 0930  BP: 123/73  Pulse: 88   Gen: No apparent distress, A&O x 3.  Detailed Urogynecologic Evaluation:  Deferred.    ASSESSMENT AND PLAN    Ms. Salle is a 53 y.o.  with:  1. Urinary frequency   2. Urgency of micturition    Negative for UTI today. Patient to adjust when she is taking the Gemtesa  to in the evenings with dinner to see if this helps her morning urgency while still supporting her during the day. We also discussed doing TENS unit at the ankle for support since she is already using a TENS on her leg to try and engage her quads after knee surgery.  Patient to continue vaginal estrogen x2 weekly. We also discussed doing uberlube for intercourse should she choose to have it.   Patient to follow up in 1 year or sooner if needed.   Eldrick Penick G Joangel Vanosdol, NP

## 2024-07-28 ENCOUNTER — Encounter: Payer: Self-pay | Admitting: Obstetrics and Gynecology

## 2024-07-28 ENCOUNTER — Telehealth: Payer: Self-pay | Admitting: Obstetrics and Gynecology

## 2024-07-28 MED ORDER — NITROFURANTOIN MONOHYD MACRO 100 MG PO CAPS: 100.0000 mg | ORAL_CAPSULE | Freq: Two times a day (BID) | ORAL | 0 refills | Status: AC

## 2024-07-28 NOTE — Telephone Encounter (Signed)
 Call after hours for symptoms of UTI  Patient reports onset of urinary frequency and burning today worsening as the day goes. Last visit at urogynecology in December for follow-up E2 vaginal cream 2 x weekly. Patient denies fever or back pain. Allergy to penicillin and sulfa.  Medical history significant for well controlled type 2 diabetes with last HgbA1C <6  Patient instructed to be seen if develops fever, back pain or worsening of symptoms.

## 2024-07-29 ENCOUNTER — Telehealth: Payer: Self-pay | Admitting: Obstetrics and Gynecology

## 2024-07-29 NOTE — Telephone Encounter (Signed)
 Call from ginger Smitty on 07/29/23 at 23:20 in regards to [name]  Reports patient is c/o urinary retention Followed by Dr Guadlupe and recently had a Foley catheter removed after voiding trial in the office on 07/28/23 Now reports unable to void and wishes to avoid ED visit.  Called Leotis RN on duty at [employer] retirement facility and ordered a Foley catheter.  RN reports is very familiar with patient who was just discharged home after needing inpatient care.  Will report to Dr Guadlupe for further follow-up in the office

## 2024-07-30 ENCOUNTER — Telehealth: Payer: Self-pay | Admitting: Pharmacy Technician

## 2024-07-30 ENCOUNTER — Telehealth: Payer: Self-pay

## 2024-07-30 ENCOUNTER — Other Ambulatory Visit (HOSPITAL_COMMUNITY): Payer: Self-pay

## 2024-07-30 NOTE — Telephone Encounter (Signed)
 Patient states her pharmacy is requiring a new PA for Gemtesa  due to being a new year!  Thank you

## 2024-07-30 NOTE — Telephone Encounter (Signed)
 Pharmacy Patient Advocate Encounter   Received notification from Pt Calls Messages that prior authorization for Gemtesa  75MG  tablets is required/requested.   Insurance verification completed.   The patient is insured through [facility].   Per test claim: PA required; PA started via CoverMyMeds. KEY [id number] . Please see clinical question(s) below that I am not finding the answer to in their chart and advise.  I only see the trial/failure of solifenacin. Has she tried any other anticholinergics? Please advise.

## 2024-08-03 ENCOUNTER — Other Ambulatory Visit (HOSPITAL_COMMUNITY): Payer: Self-pay

## 2024-08-03 NOTE — Telephone Encounter (Signed)
 Pharmacy Patient Advocate Encounter  Received notification from [facility] that Prior Authorization for  Gemtesa  75MG  tablets has been closed.     PA #/Case ID/Reference #: [id number]  New PA not needed at this time.

## 2024-08-03 NOTE — Telephone Encounter (Signed)
 Patient is aware.
